# Patient Record
Sex: Female | Born: 1970 | Hispanic: No | Smoking: Never smoker
Health system: Southern US, Community
[De-identification: ages and names within clinical notes are randomized; demographics above are authoritative.]

## PROBLEM LIST (undated history)

## (undated) DIAGNOSIS — J129 Viral pneumonia, unspecified: Secondary | ICD-10-CM

## (undated) DIAGNOSIS — M1990 Unspecified osteoarthritis, unspecified site: Secondary | ICD-10-CM

## (undated) DIAGNOSIS — J302 Other seasonal allergic rhinitis: Secondary | ICD-10-CM

## (undated) DIAGNOSIS — C801 Malignant (primary) neoplasm, unspecified: Secondary | ICD-10-CM

## (undated) DIAGNOSIS — G43909 Migraine, unspecified, not intractable, without status migrainosus: Secondary | ICD-10-CM

## (undated) DIAGNOSIS — N83209 Unspecified ovarian cyst, unspecified side: Secondary | ICD-10-CM

## (undated) DIAGNOSIS — L989 Disorder of the skin and subcutaneous tissue, unspecified: Secondary | ICD-10-CM

## (undated) DIAGNOSIS — G43109 Migraine with aura, not intractable, without status migrainosus: Secondary | ICD-10-CM

## (undated) DIAGNOSIS — N926 Irregular menstruation, unspecified: Secondary | ICD-10-CM

## (undated) HISTORY — PX: WISDOM TOOTH EXTRACTION: SHX21

## (undated) HISTORY — DX: Unspecified osteoarthritis, unspecified site: M19.90

## (undated) HISTORY — DX: Other seasonal allergic rhinitis: J30.2

## (undated) HISTORY — DX: Viral pneumonia, unspecified: J12.9

## (undated) HISTORY — DX: Irregular menstruation, unspecified: N92.6

## (undated) HISTORY — DX: Unspecified ovarian cyst, unspecified side: N83.209

## (undated) HISTORY — DX: Disorder of the skin and subcutaneous tissue, unspecified: L98.9

## (undated) HISTORY — DX: Migraine with aura, not intractable, without status migrainosus: G43.109

## (undated) HISTORY — DX: Migraine, unspecified, not intractable, without status migrainosus: G43.909

---

## 2010-04-24 ENCOUNTER — Inpatient Hospital Stay: Payer: Self-pay

## 2012-02-12 ENCOUNTER — Ambulatory Visit: Payer: Self-pay | Admitting: Obstetrics & Gynecology

## 2012-02-12 LAB — CBC
HCT: 38.8 % (ref 35.0–47.0)
MCH: 30.5 pg (ref 26.0–34.0)
MCHC: 33.4 g/dL (ref 32.0–36.0)
Platelet: 142 10*3/uL — ABNORMAL LOW (ref 150–440)
RBC: 4.24 10*6/uL (ref 3.80–5.20)
RDW: 12.7 % (ref 11.5–14.5)

## 2012-02-17 ENCOUNTER — Ambulatory Visit: Payer: Self-pay | Admitting: Obstetrics & Gynecology

## 2012-02-18 LAB — PATHOLOGY REPORT

## 2014-06-20 NOTE — Op Note (Signed)
PATIENT NAME:  Maria Ward, Maria Ward MR#:  [id number] DATE OF BIRTH:  Feb 24, 1971  DATE OF PROCEDURE:  02/17/2012  PREOPERATIVE DIAGNOSIS: Ovarian cyst pain.   POSTOPERATIVE DIAGNOSIS: Ovarian cyst pain.  PROCEDURE: Operative laparoscopy with right ovarian cystectomy.   SURGEON: Barnett Applebaum, MD   ANESTHESIA: General.   ESTIMATED BLOOD LOSS: Minimal.   COMPLICATIONS: None.   FINDINGS: A right ovarian cyst is identified, hemorrhagic in nature. A left tubal cyst is visualized. Normal appendix and uterus.   DISPOSITION: To recovery room stable.   TECHNIQUE: The patient is prepped and draped in the usual sterile fashion. After adequate anesthesia is attained in the dorsal lithotomy position, a  sponge stick is placed vaginally, and the bladder is drained with a red Robinson catheter.   Attention is then turned to the abdomen where a Veress needle is inserted through a 5 mm infraumbilical incision after Marcaine is used to anesthetize the skin. Veress needle placement is confirmed using the hanging drop technique, and the abdomen is then insufflated with CO2 gas. A 5 mm trocar is then inserted under direct visualization with the laparoscope with no injuries or bleeding noted. The patient is placed in Trendelenburg position, and the above mentioned findings are visualized.   A 5 mm trocar is placed in the right lower quadrant, and an 11 mm trocar is placed in the left lower quadrant lateral to the inferior epigastric blood vessels with no injuries or bleeding noted. The right ovarian cyst is grasped and carefully dissected free using the 5 mm harmonic scalpel with excellent hemostasis noted at the operative site. The cyst is removed and sent to pathology for further review. The left tubal cyst is also easily excised and removed and sent with the specimen. Excellent hemostasis is noted. Fluid is aspirated from the posterior cul-de-sac with a syringe. Trocars are removed and gas is expelled, and the skin is  closed with Dermabond. The patient goes to the recovery room in stable condition. All sponge, instrument, and needle counts are correct.   ____________________________ R. Barnett Applebaum, MD rph:cb D: 02/17/2012 11:03:41 ET T: 02/17/2012 20:49:55 ET JOB#: [id number]  cc: Glean Salen, MD, <Dictator> Gae Dry MD ELECTRONICALLY SIGNED 02/18/2012 6:25

## 2015-01-02 HISTORY — PX: OVARIAN CYST REMOVAL: SHX89

## 2016-12-01 ENCOUNTER — Encounter: Payer: Self-pay | Admitting: Obstetrics and Gynecology

## 2016-12-01 ENCOUNTER — Ambulatory Visit (INDEPENDENT_AMBULATORY_CARE_PROVIDER_SITE_OTHER): Payer: Self-pay | Admitting: Obstetrics and Gynecology

## 2016-12-01 VITALS — BP 112/72 | HR 85 | Ht 65.0 in | Wt 172.0 lb

## 2016-12-01 DIAGNOSIS — Z3041 Encounter for surveillance of contraceptive pills: Secondary | ICD-10-CM

## 2016-12-01 DIAGNOSIS — Z1231 Encounter for screening mammogram for malignant neoplasm of breast: Secondary | ICD-10-CM

## 2016-12-01 DIAGNOSIS — Z01419 Encounter for gynecological examination (general) (routine) without abnormal findings: Secondary | ICD-10-CM

## 2016-12-01 DIAGNOSIS — Z1239 Encounter for other screening for malignant neoplasm of breast: Secondary | ICD-10-CM

## 2016-12-01 MED ORDER — NORETHINDRONE 0.35 MG PO TABS
1.0000 | ORAL_TABLET | Freq: Every day | ORAL | 3 refills | Status: DC
Start: 2016-12-01 — End: 2017-12-21

## 2016-12-01 NOTE — Progress Notes (Signed)
PCP:  Lynnell Jude, MD   Chief Complaint  Patient presents with  . Gynecologic Exam     HPI:      Maria Ward is a 46 y.o. [phone] who LMP was Patient's last menstrual period was 10/19/2016 (exact date)., presents today for her annual examination.  Her menses are Q1-6 months with POPs, lasting 7 days.  Dysmenorrhea none. She does not have intermenstrual bleeding. Migraines improved this yr.  Sex activity: single partner, contraception - oral progesterone-only contraceptive.  Last Pap: September 01, 2014  Results were: no abnormalities /neg HPV DNA   Last mammogram: not recently.  There is a FH of breast cancer in her PGM, genetic testing not indicated. There is no FH of ovarian cancer. The patient does not do self-breast exams.  Tobacco use: The patient denies current or previous tobacco use. Alcohol use: glass of wine with dinner No drug use.  Exercise: not active  She does get adequate calcium and Vitamin D in her diet.  Labs with PCP.     Past Medical History:  Diagnosis Date  . Irregular menses   . Migraine   . Ovarian cyst   . Seasonal allergies     Past Surgical History:  Procedure Laterality Date  . OVARIAN CYST REMOVAL  01/2015  . WISDOM TOOTH EXTRACTION      Family History  Problem Relation Age of Onset  . Bladder Cancer Mother   . Colon polyps Mother   . Gallbladder disease Mother   . Hyperlipidemia Mother   . Thyroid disease Mother   . Hypertension Father   . Skin cancer Father   . Breast cancer Paternal Grandmother 22    Social History   Social History  . Marital status: Unknown    Spouse name: N/A  . Number of children: N/A  . Years of education: N/A   Occupational History  . Not on file.   Social History Main Topics  . Smoking status: Never Smoker  . Smokeless tobacco: Never Used  . Alcohol use 0.6 oz/week    1 Glasses of wine per week     Comment: every night with dinner  . Drug use: No  . Sexual activity: Yes    Birth  control/ protection: Pill   Other Topics Concern  . Not on file   Social History Narrative  . No narrative on file    Current Meds  Medication Sig  . norethindrone (MICRONOR,CAMILA,ERRIN) 0.35 MG tablet Take 1 tablet (0.35 mg total) by mouth daily.  . [DISCONTINUED] norethindrone (MICRONOR,CAMILA,ERRIN) 0.35 MG tablet Take 1 tablet by mouth daily.     ROS:  Review of Systems  Constitutional: Negative for fatigue, fever and unexpected weight change.  Respiratory: Negative for cough, shortness of breath and wheezing.   Cardiovascular: Negative for chest pain, palpitations and leg swelling.  Gastrointestinal: Negative for blood in stool, constipation, diarrhea, nausea and vomiting.  Endocrine: Negative for cold intolerance, heat intolerance and polyuria.  Genitourinary: Negative for dyspareunia, dysuria, flank pain, frequency, genital sores, hematuria, menstrual problem, pelvic pain, urgency, vaginal bleeding, vaginal discharge and vaginal pain.  Musculoskeletal: Negative for back pain, joint swelling and myalgias.  Skin: Negative for rash.  Neurological: Negative for dizziness, syncope, light-headedness, numbness and headaches.  Hematological: Negative for adenopathy.  Psychiatric/Behavioral: Negative for agitation, confusion, sleep disturbance and suicidal ideas. The patient is not nervous/anxious.      Objective: BP 112/72   Pulse 85   Ht 5\' 5"  (1.651 m)  Wt 172 lb (78 kg)   LMP 10/19/2016 (Exact Date)   BMI 28.62 kg/m    Physical Exam  Constitutional: She is oriented to person, place, and time. She appears well-developed and well-nourished.  Genitourinary: Vagina normal and uterus normal. There is no rash or tenderness on the right labia. There is no rash or tenderness on the left labia. No erythema or tenderness in the vagina. No vaginal discharge found. Right adnexum does not display mass and does not display tenderness. Left adnexum does not display mass and does not  display tenderness. Cervix does not exhibit motion tenderness or polyp. Uterus is not enlarged or tender.  Neck: Normal range of motion. No thyromegaly present.  Cardiovascular: Normal rate, regular rhythm and normal heart sounds.   No murmur heard. Pulmonary/Chest: Effort normal and breath sounds normal. Right breast exhibits no mass, no nipple discharge, no skin change and no tenderness. Left breast exhibits no mass, no nipple discharge, no skin change and no tenderness.  Abdominal: Soft. There is no tenderness. There is no guarding.  Musculoskeletal: Normal range of motion.  Neurological: She is alert and oriented to person, place, and time. No cranial nerve deficit.  Psychiatric: She has a normal mood and affect. Her behavior is normal.  Vitals reviewed.   Assessment/Plan: Encounter for annual routine gynecological examination  Screening for breast cancer - Will sched through [facility]. - Plan: MM DIGITAL SCREENING BILATERAL  Encounter for surveillance of contraceptive pills - OCP RF.  - Plan: norethindrone (MICRONOR,CAMILA,ERRIN) 0.35 MG tablet  Meds ordered this encounter  Medications  . DISCONTD: norethindrone (MICRONOR,CAMILA,ERRIN) 0.35 MG tablet    Sig: Take 1 tablet by mouth daily.  . norethindrone (MICRONOR,CAMILA,ERRIN) 0.35 MG tablet    Sig: Take 1 tablet (0.35 mg total) by mouth daily.    Dispense:  3 Package    Refill:  3             GYN counsel mammography screening, adequate intake of calcium and vitamin D, diet and exercise    F/U  Return in about 1 year (around 12/01/2017).  Sanad Fearnow B. Dylan Ruotolo, PA-C 12/01/2016 10:38 AM

## 2016-12-26 ENCOUNTER — Ambulatory Visit
Admission: RE | Admit: 2016-12-26 | Discharge: 2016-12-26 | Disposition: A | Discharge status: Home / Self Care | Payer: Self-pay | Source: Ambulatory Visit | Attending: Obstetrics and Gynecology | Admitting: Obstetrics and Gynecology

## 2016-12-26 DIAGNOSIS — Z1239 Encounter for other screening for malignant neoplasm of breast: Secondary | ICD-10-CM

## 2017-01-02 ENCOUNTER — Other Ambulatory Visit: Payer: Self-pay | Admitting: *Deleted

## 2017-01-02 ENCOUNTER — Inpatient Hospital Stay
Admission: RE | Admit: 2017-01-02 | Discharge: 2017-01-02 | Disposition: A | Discharge status: Home / Self Care | Payer: Self-pay | Source: Ambulatory Visit | Attending: *Deleted | Admitting: *Deleted

## 2017-01-02 DIAGNOSIS — Z9289 Personal history of other medical treatment: Secondary | ICD-10-CM

## 2017-01-07 ENCOUNTER — Other Ambulatory Visit: Payer: Self-pay | Admitting: Obstetrics and Gynecology

## 2017-01-07 DIAGNOSIS — N6489 Other specified disorders of breast: Secondary | ICD-10-CM

## 2017-01-07 DIAGNOSIS — R928 Other abnormal and inconclusive findings on diagnostic imaging of breast: Secondary | ICD-10-CM

## 2017-01-13 ENCOUNTER — Ambulatory Visit
Admission: RE | Admit: 2017-01-13 | Discharge: 2017-01-13 | Disposition: A | Discharge status: Home / Self Care | Payer: Self-pay | Source: Ambulatory Visit | Attending: Obstetrics and Gynecology | Admitting: Obstetrics and Gynecology

## 2017-01-13 ENCOUNTER — Encounter: Payer: Self-pay | Admitting: Obstetrics and Gynecology

## 2017-01-13 DIAGNOSIS — R928 Other abnormal and inconclusive findings on diagnostic imaging of breast: Secondary | ICD-10-CM

## 2017-01-13 DIAGNOSIS — N6489 Other specified disorders of breast: Secondary | ICD-10-CM

## 2017-12-21 ENCOUNTER — Ambulatory Visit: Payer: Self-pay | Admitting: Obstetrics and Gynecology

## 2017-12-21 ENCOUNTER — Telehealth: Payer: Self-pay | Admitting: Obstetrics and Gynecology

## 2017-12-21 ENCOUNTER — Other Ambulatory Visit: Payer: Self-pay | Admitting: Obstetrics and Gynecology

## 2017-12-21 DIAGNOSIS — Z3041 Encounter for surveillance of contraceptive pills: Secondary | ICD-10-CM

## 2017-12-21 NOTE — Telephone Encounter (Signed)
Patient originally had appt for annual 10/21 but we had to r/s, she is rescheduled to 11/5 but will not have enough bc to get her to that point.  She needs Rx on paper so she can carry to to pharmacy to see which is cheaper.

## 2017-12-21 NOTE — Telephone Encounter (Signed)
Please advise 

## 2017-12-22 ENCOUNTER — Other Ambulatory Visit: Payer: Self-pay | Admitting: Obstetrics and Gynecology

## 2017-12-22 DIAGNOSIS — Z3041 Encounter for surveillance of contraceptive pills: Secondary | ICD-10-CM

## 2017-12-22 MED ORDER — NORETHINDRONE 0.35 MG PO TABS: 1.0000 | ORAL_TABLET | Freq: Every day | ORAL | 0 refills | Status: DC

## 2017-12-22 NOTE — Progress Notes (Signed)
Rx RF till annual. Pt needs hard copy.

## 2017-12-22 NOTE — Telephone Encounter (Signed)
Rx printed. Does pt want to pick up?

## 2017-12-22 NOTE — Telephone Encounter (Signed)
Pt would like Rx faxed to pharmacy on chart [facility] on [address].)

## 2017-12-22 NOTE — Telephone Encounter (Signed)
Called pt and left vm to call back.

## 2018-01-05 ENCOUNTER — Ambulatory Visit: Payer: Self-pay | Admitting: Obstetrics and Gynecology

## 2018-01-05 ENCOUNTER — Encounter: Payer: Self-pay | Admitting: Obstetrics and Gynecology

## 2018-01-05 VITALS — BP 106/70 | HR 101 | Ht 65.0 in | Wt 178.0 lb

## 2018-01-05 DIAGNOSIS — Z3041 Encounter for surveillance of contraceptive pills: Secondary | ICD-10-CM

## 2018-01-05 DIAGNOSIS — Z13220 Encounter for screening for lipoid disorders: Secondary | ICD-10-CM

## 2018-01-05 DIAGNOSIS — Z124 Encounter for screening for malignant neoplasm of cervix: Secondary | ICD-10-CM

## 2018-01-05 DIAGNOSIS — N938 Other specified abnormal uterine and vaginal bleeding: Secondary | ICD-10-CM

## 2018-01-05 DIAGNOSIS — Z0000 Encounter for general adult medical examination without abnormal findings: Secondary | ICD-10-CM

## 2018-01-05 DIAGNOSIS — Z1239 Encounter for other screening for malignant neoplasm of breast: Secondary | ICD-10-CM

## 2018-01-05 DIAGNOSIS — Z1151 Encounter for screening for human papillomavirus (HPV): Secondary | ICD-10-CM

## 2018-01-05 DIAGNOSIS — Z01419 Encounter for gynecological examination (general) (routine) without abnormal findings: Secondary | ICD-10-CM

## 2018-01-05 LAB — HM PAP SMEAR: HM Pap smear: NORMAL

## 2018-01-05 MED ORDER — NORETHINDRONE 0.35 MG PO TABS: 1.0000 | ORAL_TABLET | Freq: Every day | ORAL | 3 refills | Status: DC

## 2018-01-05 NOTE — Patient Instructions (Signed)
I value your feedback and entrusting us with your care. If you get a [facility] patient survey, I would appreciate you taking the time to let us know about your experience today. Thank you! 

## 2018-01-05 NOTE — Progress Notes (Signed)
PCP:  Lynnell Jude, MD   Chief Complaint  Patient presents with  . Gynecologic Exam    had 3 periods in October, concerned, thinks she felt a lump on her left breast, no tenderness x a few days ago     HPI:      Ms. Maria Ward is a 47 y.o. [phone] who LMP was Patient's last menstrual period was 12/30/2017 (exact date)., presents today for her annual examination.  Her menses are Q5-8 wks with POPs, lasting 6-7 days.  Dysmenorrhea none. She had 3 periods in Oct. 2 were very heavy, changing pads/tampons hourly and with clots. The 3rd one lasted 5 days and was a little lighter. No late/missed pills. No pelvic pain. Unusual for pt. Migraines improved this yr.   Sex activity: single partner, contraception - oral progesterone-only contraceptive.  Last Pap: September 01, 2014  Results were: no abnormalities /neg HPV DNA   Last mammogram: 01/13/17 Results: no abnormalities after addl views; repeat in 12 months. Pt noted LT breast mass, size of a marble, a few wks ago but sx have resolved. Can no longer feel it. Hx of cluster of LT breast cysts on mammo/u/s last yr.  There is a FH of breast cancer in her PGM, genetic testing not indicated. There is no FH of ovarian cancer. The patient does not do self-breast exams.  Tobacco use: The patient denies current or previous tobacco use. Alcohol use: glass of wine with dinner No drug use.  Exercise: occas active  She does get adequate calcium but not Vitamin D in her diet.  No recent labs.    Past Medical History:  Diagnosis Date  . Arthritis    knees  . Irregular menses   . Migraine   . Ovarian cyst   . Pneumonia, viral   . Seasonal allergies   . Skin disorder    skin rash on right arm, tx with Triamcinolone per Dermatologist    Past Surgical History:  Procedure Laterality Date  . OVARIAN CYST REMOVAL  01/2015  . WISDOM TOOTH EXTRACTION      Family History  Problem Relation Age of Onset  . Bladder Cancer Mother   . Colon polyps  Mother   . Gallbladder disease Mother   . Hyperlipidemia Mother   . Thyroid disease Mother   . Hypertension Father   . Skin cancer Father   . Breast cancer Paternal Grandmother 95       Malignant    Social History   Socioeconomic History  . Marital status: Unknown    Spouse name: Not on file  . Number of children: Not on file  . Years of education: Not on file  . Highest education level: Not on file  Occupational History  . Not on file  Social Needs  . Financial resource strain: Not on file  . Food insecurity:    Worry: Not on file    Inability: Not on file  . Transportation needs:    Medical: Not on file    Non-medical: Not on file  Tobacco Use  . Smoking status: Never Smoker  . Smokeless tobacco: Never Used  Substance and Sexual Activity  . Alcohol use: Yes    Alcohol/week: 1.0 standard drinks    Types: 1 Glasses of wine per week    Comment: every night with dinner  . Drug use: No  . Sexual activity: Yes    Birth control/protection: Pill  Lifestyle  . Physical activity:  Days per week: Not on file    Minutes per session: Not on file  . Stress: Not on file  Relationships  . Social connections:    Talks on phone: Not on file    Gets together: Not on file    Attends religious service: Not on file    Active member of club or organization: Not on file    Attends meetings of clubs or organizations: Not on file    Relationship status: Not on file  . Intimate partner violence:    Fear of current or ex partner: Not on file    Emotionally abused: Not on file    Physically abused: Not on file    Forced sexual activity: Not on file  Other Topics Concern  . Not on file  Social History Narrative  . Not on file    Current Meds  Medication Sig  . norethindrone (HEATHER) 0.35 MG tablet Take 1 tablet (0.35 mg total) by mouth daily.  . [DISCONTINUED] norethindrone (HEATHER) 0.35 MG tablet Take 1 tablet (0.35 mg total) by mouth daily.     ROS:  Review of  Systems  Constitutional: Negative for fatigue, fever and unexpected weight change.  Respiratory: Negative for cough, shortness of breath and wheezing.   Cardiovascular: Negative for chest pain, palpitations and leg swelling.  Gastrointestinal: Negative for blood in stool, constipation, diarrhea, nausea and vomiting.  Endocrine: Negative for cold intolerance, heat intolerance and polyuria.  Genitourinary: Positive for menstrual problem. Negative for dyspareunia, dysuria, flank pain, frequency, genital sores, hematuria, pelvic pain, urgency, vaginal bleeding, vaginal discharge and vaginal pain.  Musculoskeletal: Positive for arthralgias. Negative for back pain, joint swelling and myalgias.  Skin: Negative for rash.  Neurological: Negative for dizziness, syncope, light-headedness, numbness and headaches.  Hematological: Negative for adenopathy.  Psychiatric/Behavioral: Negative for agitation, confusion, sleep disturbance and suicidal ideas. The patient is not nervous/anxious.      Objective: BP 106/70   Pulse (!) 101   Ht 5\' 5"  (1.651 m)   Wt 178 lb (80.7 kg)   LMP 12/30/2017 (Exact Date)   BMI 29.62 kg/m    Physical Exam  Constitutional: She is oriented to person, place, and time. She appears well-developed and well-nourished.  Genitourinary: Vagina normal and uterus normal. There is no rash or tenderness on the right labia. There is no rash or tenderness on the left labia. No erythema or tenderness in the vagina. No vaginal discharge found. Right adnexum does not display mass and does not display tenderness. Left adnexum does not display mass and does not display tenderness. Cervix does not exhibit motion tenderness or polyp. Uterus is not enlarged or tender.  Neck: Normal range of motion. No thyromegaly present.  Cardiovascular: Normal rate, regular rhythm and normal heart sounds.  No murmur heard. Pulmonary/Chest: Effort normal and breath sounds normal. Right breast exhibits no mass,  no nipple discharge, no skin change and no tenderness. Left breast exhibits no mass, no nipple discharge, no skin change and no tenderness.  Abdominal: Soft. There is no tenderness. There is no guarding.  Musculoskeletal: Normal range of motion.  Neurological: She is alert and oriented to person, place, and time. No cranial nerve deficit.  Psychiatric: She has a normal mood and affect. Her behavior is normal.  Vitals reviewed.   Assessment/Plan: Encounter for annual routine gynecological examination  Cervical cancer screening - Plan: Cytology - PAP  Screening for HPV (human papillomavirus) - Plan: Cytology - PAP  Encounter for surveillance of contraceptive pills -  POP RF.  - Plan: norethindrone (HEATHER) 0.35 MG tablet  DUB (dysfunctional uterine bleeding) - On POPs last month. Follow sx expectantly. If persist, will check thyroid with future labs. F/u prn.   Screening for breast cancer - Pt to sched scr mammo. Neg breast exam/no masses.  - Plan: MM 3D SCREEN BREAST BILATERAL  Blood tests for routine general physical examination - Plan: Comprehensive metabolic panel, Lipid panel  Screening cholesterol level - Plan: Lipid panel  Meds ordered this encounter  Medications  . norethindrone (HEATHER) 0.35 MG tablet    Sig: Take 1 tablet (0.35 mg total) by mouth daily.    Dispense:  84 tablet    Refill:  3    Order Specific Question:   Supervising Provider    Answer:   Gae Dry [[id number]]             GYN counsel mammography screening, adequate intake of calcium and vitamin D, diet and exercise    F/U  Return in about 1 year (around 01/06/2019).  Tawsha Terrero B. Gian Ybarra, PA-C 01/05/2018 3:10 PM

## 2018-01-06 ENCOUNTER — Other Ambulatory Visit: Payer: Self-pay | Admitting: Obstetrics and Gynecology

## 2018-01-06 ENCOUNTER — Telehealth: Payer: Self-pay

## 2018-01-06 DIAGNOSIS — Z0000 Encounter for general adult medical examination without abnormal findings: Secondary | ICD-10-CM

## 2018-01-06 DIAGNOSIS — Z13220 Encounter for screening for lipoid disorders: Secondary | ICD-10-CM

## 2018-01-06 NOTE — Telephone Encounter (Signed)
Orders changed and released.

## 2018-01-06 NOTE — Telephone Encounter (Signed)
Pt would like the order for her lab work to be changed to where she can have her blood work done at a [place] facility sense her son has to have blood drawn there anyway. Thank you!

## 2018-01-06 NOTE — Addendum Note (Signed)
Addended by: Ardeth Perfect B on: 20/10/1386 02:04 PM   Modules accepted: Orders

## 2018-01-07 LAB — CYTOLOGY - PAP
Diagnosis: NEGATIVE
HPV: NOT DETECTED

## 2018-09-28 IMAGING — MG MM DIGITAL DIAGNOSTIC UNILAT*L* W/ TOMO W/ CAD
6 series · 6 of 14 positions shown · non-contrast
Comparison: Previous exam(s).

CLINICAL DATA: The patient was called back from screening
mammography due to a left breast asymmetry.

EXAM:
2D DIGITAL DIAGNOSTIC LEFT MAMMOGRAM WITH CAD AND ADJUNCT TOMO
ULTRASOUND LEFT BREAST

[L MLO]
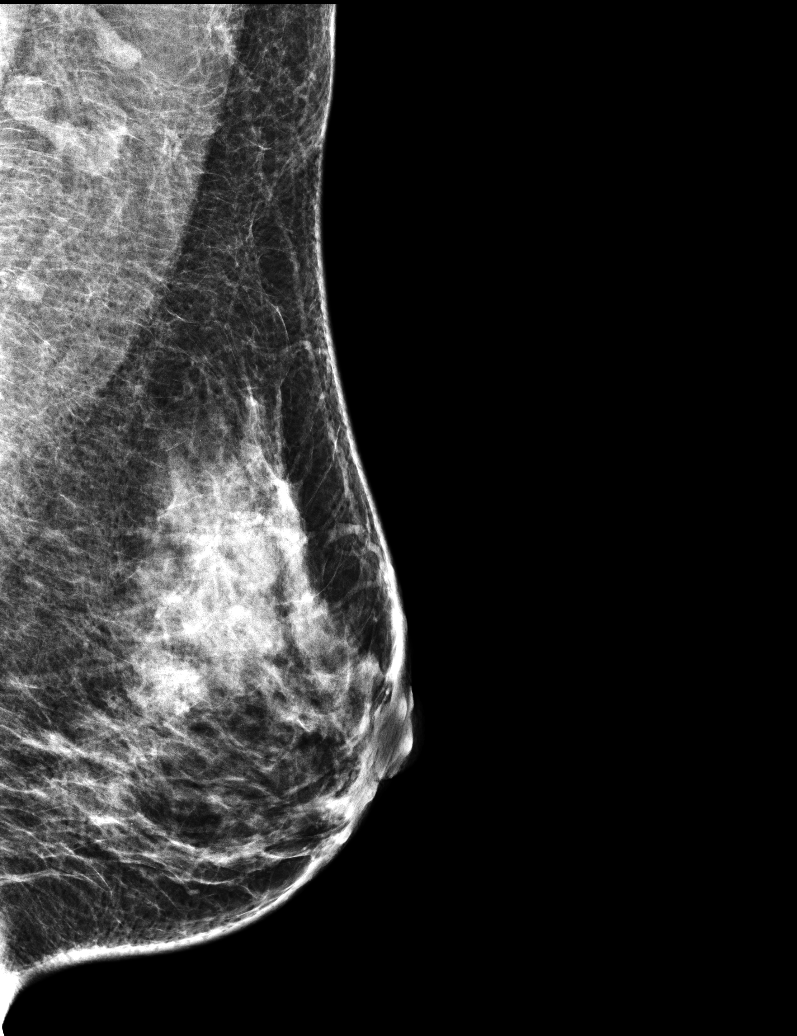

[L CC]
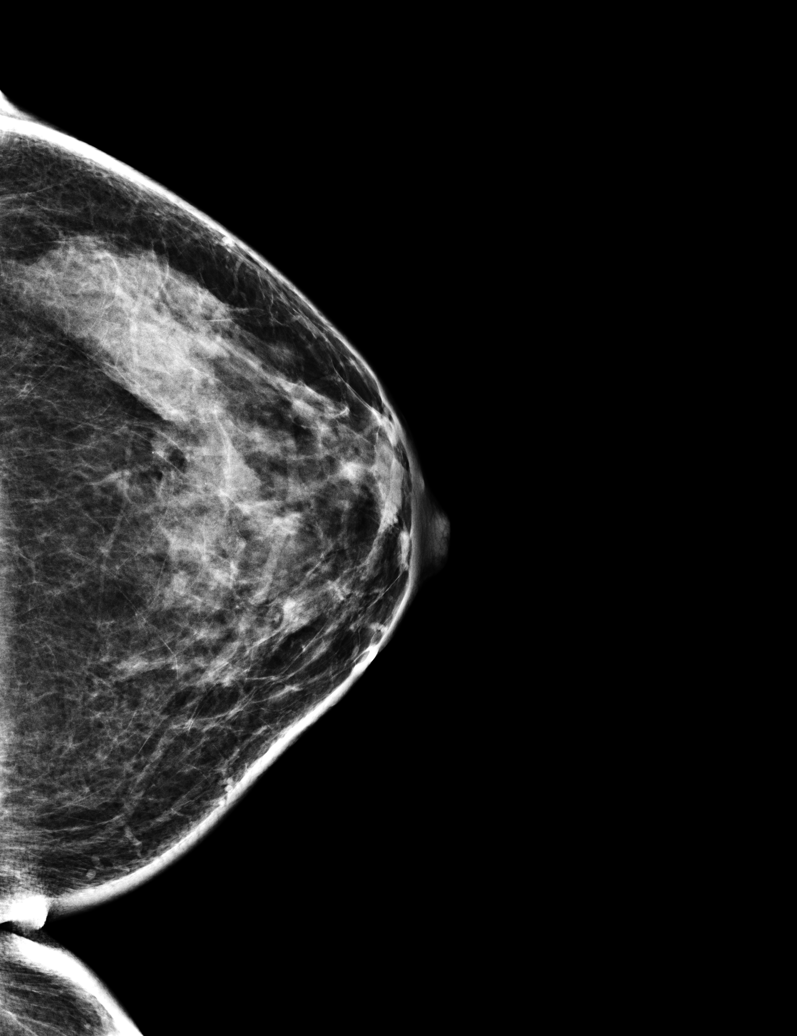

[L MLO synth-2D]
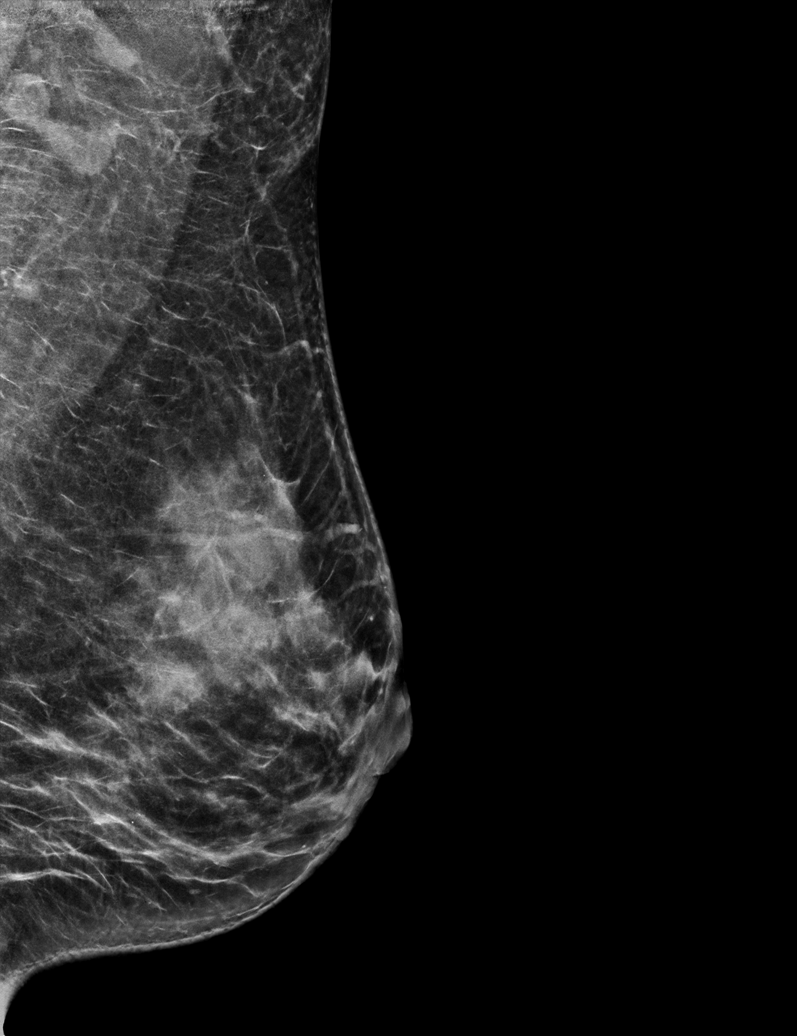

[L CC synth-2D]
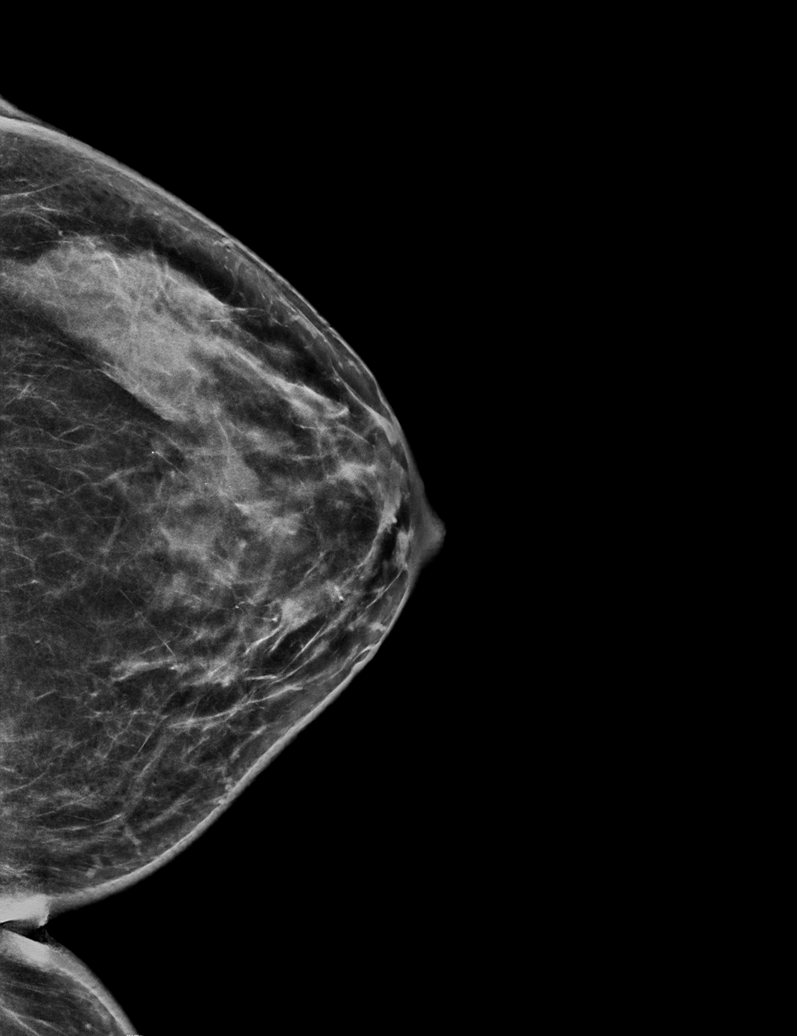

[L CC tomo · tomo slice 31/62.0]
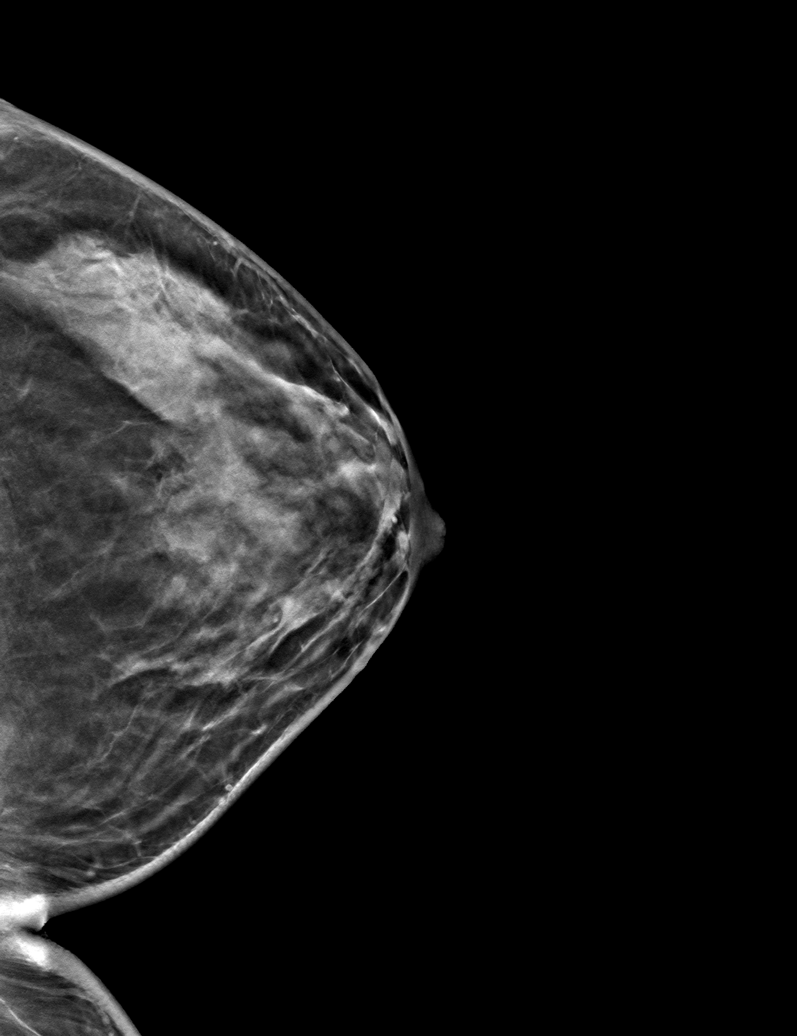

[L MLO tomo · tomo slice 32/63.0]
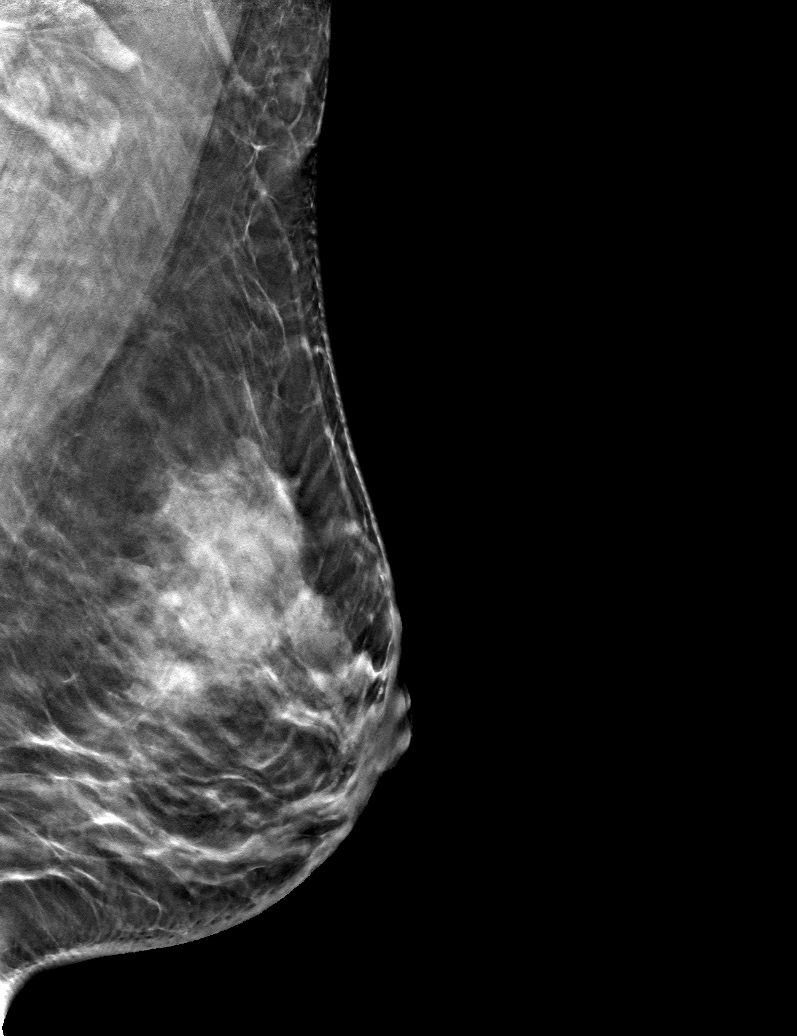

[6 of 14 positions shown; findings below may reference images not displayed]

ACR Breast Density Category c: The breast tissue is heterogeneously
dense, which may obscure small masses.
FINDINGS: The asymmetry on the left resolves on additional imaging.

Mammographic images were processed with CAD.

On physical exam, no suspicious findings are identified.

Targeted ultrasound is performed, showing a cluster of cysts in the
upper left breast. No suspicious findings. Ultrasound of the entire
lateral right breast demonstrated dense glandular tissue.
IMPRESSION: No mammographic or sonographic evidence of malignancy.

RECOMMENDATION:
Annual screening mammography

I have discussed the findings and recommendations with the patient.
Results were also provided in writing at the conclusion of the
visit. If applicable, a reminder letter will be sent to the patient
regarding the next appointment.

BI-RADS CATEGORY  2: Benign.

## 2018-11-02 ENCOUNTER — Ambulatory Visit: Payer: Self-pay | Admitting: Obstetrics and Gynecology

## 2018-12-07 ENCOUNTER — Other Ambulatory Visit: Payer: Self-pay | Admitting: Obstetrics and Gynecology

## 2018-12-07 DIAGNOSIS — Z1231 Encounter for screening mammogram for malignant neoplasm of breast: Secondary | ICD-10-CM

## 2018-12-27 ENCOUNTER — Other Ambulatory Visit: Payer: Self-pay | Admitting: Obstetrics and Gynecology

## 2018-12-27 ENCOUNTER — Ambulatory Visit
Admission: RE | Admit: 2018-12-27 | Discharge: 2018-12-27 | Disposition: A | Discharge status: Home / Self Care | Payer: Self-pay | Source: Ambulatory Visit | Attending: Obstetrics and Gynecology | Admitting: Obstetrics and Gynecology

## 2018-12-27 ENCOUNTER — Encounter (INDEPENDENT_AMBULATORY_CARE_PROVIDER_SITE_OTHER): Payer: Self-pay

## 2018-12-27 ENCOUNTER — Other Ambulatory Visit: Payer: Self-pay

## 2018-12-27 DIAGNOSIS — N6310 Unspecified lump in the right breast, unspecified quadrant: Secondary | ICD-10-CM

## 2018-12-27 DIAGNOSIS — R928 Other abnormal and inconclusive findings on diagnostic imaging of breast: Secondary | ICD-10-CM

## 2018-12-27 DIAGNOSIS — Z1231 Encounter for screening mammogram for malignant neoplasm of breast: Secondary | ICD-10-CM | POA: Insufficient documentation

## 2018-12-27 HISTORY — DX: Malignant (primary) neoplasm, unspecified: C80.1

## 2019-01-05 ENCOUNTER — Encounter: Payer: Self-pay | Admitting: Obstetrics and Gynecology

## 2019-01-05 ENCOUNTER — Ambulatory Visit
Admission: RE | Admit: 2019-01-05 | Discharge: 2019-01-05 | Disposition: A | Discharge status: Home / Self Care | Payer: Self-pay | Source: Ambulatory Visit | Attending: Obstetrics and Gynecology | Admitting: Obstetrics and Gynecology

## 2019-01-05 ENCOUNTER — Telehealth: Payer: Self-pay | Admitting: Obstetrics and Gynecology

## 2019-01-05 DIAGNOSIS — R928 Other abnormal and inconclusive findings on diagnostic imaging of breast: Secondary | ICD-10-CM

## 2019-01-05 DIAGNOSIS — N6310 Unspecified lump in the right breast, unspecified quadrant: Secondary | ICD-10-CM

## 2019-01-05 NOTE — Telephone Encounter (Signed)
Pt aware of cat 3 RT breast due to cysts. Repeat u/s in 6 months. SBE and f/u if any breast changes. Has annual next wk. Questions answered.

## 2019-01-09 NOTE — Progress Notes (Signed)
PCP:  Lynnell Jude, MD   Chief Complaint  Patient presents with  . Gynecologic Exam    still having very very heavy cycles     HPI:      Ms. Maria Ward is a 48 y.o. [phone] who LMP was Patient's last menstrual period was 10/12/2018 (exact date)., presents today for her annual examination.  Her menses are Q1-3 months with POPs, lasting 7 days, heavy flow, changing tampons/pads Q1 hrs with clots.  Dysmenorrhea none.  Her LMP 8/20 lasted 21 days, heavy flow most of the time. Pt then stopped her POPs since not helpful with her cycles. Hx of migraines with aura. No recent thyroid/anemia labs. Hx of ovar cysts in past.   Sex activity: single partner, contraception - oral progesterone-only contraceptive. No dyspareunia/postcoital bleeding.  Last Pap: 01/05/18  Results were: no abnormalities /neg HPV DNA   Last mammogram: 01/05/19 Results: cat 3 RT breast due to cluster of cysts; repeat u/s due in 6 months.   There is a FH of breast cancer in her PGM, genetic testing not indicated. There is no FH of ovarian cancer. The patient does not do self-breast exams.  Tobacco use: The patient denies current or previous tobacco use. Alcohol use: glass of wine with dinner No drug use.  Exercise: occas active  She does get adequate calcium and Vitamin D in her diet. Sometimes gets iron supp in [facility].  Pt with migraines with aura. Had gone a couple yrs without them but have recurred twice in past 6 months. Had imitrex Rx in past and would like RF.   Never did fasting labs last yr.   Past Medical History:  Diagnosis Date  . Arthritis    knees  . Cancer ([place])    skin ca  . Irregular menses   . Migraine with aura   . Ovarian cyst   . Pneumonia, viral   . Seasonal allergies   . Skin disorder    skin rash on right arm, tx with Triamcinolone per Dermatologist    Past Surgical History:  Procedure Laterality Date  . OVARIAN CYST REMOVAL  01/2015  . WISDOM TOOTH EXTRACTION      Family  History  Problem Relation Age of Onset  . Bladder Cancer Mother   . Colon polyps Mother   . Gallbladder disease Mother   . Hyperlipidemia Mother   . Thyroid disease Mother   . Hypertension Father   . Skin cancer Father   . Breast cancer Paternal Grandmother 23       Malignant    Social History   Socioeconomic History  . Marital status: Unknown    Spouse name: Not on file  . Number of children: Not on file  . Years of education: Not on file  . Highest education level: Not on file  Occupational History  . Not on file  Social Needs  . Financial resource strain: Not on file  . Food insecurity    Worry: Not on file    Inability: Not on file  . Transportation needs    Medical: Not on file    Non-medical: Not on file  Tobacco Use  . Smoking status: Never Smoker  . Smokeless tobacco: Never Used  Substance and Sexual Activity  . Alcohol use: Yes    Alcohol/week: 1.0 standard drinks    Types: 1 Glasses of wine per week    Comment: every night with dinner  . Drug use: No  . Sexual activity: Yes  Birth control/protection: None  Lifestyle  . Physical activity    Days per week: Not on file    Minutes per session: Not on file  . Stress: Not on file  Relationships  . Social Herbalist on phone: Not on file    Gets together: Not on file    Attends religious service: Not on file    Active member of club or organization: Not on file    Attends meetings of clubs or organizations: Not on file    Relationship status: Not on file  . Intimate partner violence    Fear of current or ex partner: Not on file    Emotionally abused: Not on file    Physically abused: Not on file    Forced sexual activity: Not on file  Other Topics Concern  . Not on file  Social History Narrative  . Not on file    No outpatient medications have been marked as taking for the 01/10/19 encounter (Office Visit) with Cierria Height, Deirdre Evener, PA-C.     ROS:  Review of Systems  Constitutional:  Negative for fatigue, fever and unexpected weight change.  Respiratory: Negative for cough, shortness of breath and wheezing.   Cardiovascular: Negative for chest pain, palpitations and leg swelling.  Gastrointestinal: Negative for blood in stool, constipation, diarrhea, nausea and vomiting.  Endocrine: Negative for cold intolerance, heat intolerance and polyuria.  Genitourinary: Positive for menstrual problem. Negative for dyspareunia, dysuria, flank pain, frequency, genital sores, hematuria, pelvic pain, urgency, vaginal bleeding, vaginal discharge and vaginal pain.  Musculoskeletal: Negative for arthralgias, back pain, joint swelling and myalgias.  Skin: Negative for rash.  Neurological: Negative for dizziness, syncope, light-headedness, numbness and headaches.  Hematological: Negative for adenopathy.  Psychiatric/Behavioral: Negative for agitation, confusion, sleep disturbance and suicidal ideas. The patient is not nervous/anxious.      Objective: BP 110/80   Ht 5\' 5"  (1.651 m)   Wt 187 lb (84.8 kg)   LMP 10/12/2018 (Exact Date)   BMI 31.12 kg/m    Physical Exam Constitutional:      Appearance: She is well-developed.  Genitourinary:     Vulva, vagina, uterus, right adnexa and left adnexa normal.     No vulval lesion or tenderness noted.     No vaginal discharge, erythema or tenderness.     No cervical motion tenderness or polyp.     Uterus is not enlarged or tender.     No right or left adnexal mass present.     Right adnexa not tender.     Left adnexa not tender.  Neck:     Musculoskeletal: Normal range of motion.     Thyroid: No thyromegaly.  Cardiovascular:     Rate and Rhythm: Normal rate and regular rhythm.     Heart sounds: Normal heart sounds. No murmur.  Pulmonary:     Effort: Pulmonary effort is normal.     Breath sounds: Normal breath sounds.  Chest:     Breasts:        Right: No mass, nipple discharge, skin change or tenderness.        Left: No mass,  nipple discharge, skin change or tenderness.  Abdominal:     Palpations: Abdomen is soft.     Tenderness: There is no abdominal tenderness. There is no guarding.  Musculoskeletal: Normal range of motion.  Neurological:     General: No focal deficit present.     Mental Status: She is alert and oriented  to person, place, and time.     Cranial Nerves: No cranial nerve deficit.  Skin:    General: Skin is warm and dry.  Psychiatric:        Mood and Affect: Mood normal.        Behavior: Behavior normal.        Thought Content: Thought content normal.        Judgment: Judgment normal.  Vitals signs reviewed.    Results for orders placed or performed in visit on 01/10/19 (from the past 24 hour(s))  POCT hemoglobin     Status: Normal   Collection Time: 01/10/19 11:36 AM  Result Value Ref Range   Hemoglobin 13.4 11 - 14.6 g/dL    Assessment/Plan: Encounter for annual routine gynecological examination  Encounter for screening mammogram for malignant neoplasm of breast; pt current on mammo. Repeat u/s RT breast due 4/21.  Menorrhagia with regular cycle - Plan: TSH + free T4, POCT hemoglobin; Normal HgB. Discussed resumption of POPs even though not great for menorrhagia, depo, IUD, ablation, lysteda (pt is self pay). Check thyroid. May need GYN u/s if DUB persists on POPs.  Encounter for surveillance of contraceptive pills - POP RF.  - Plan: norethindrone (HEATHER) 0.35 MG tablet; Rx RF. Start with next menses, f/u prn.  Intractable migraine with aura without status migrainosus - Plan: SUMAtriptan (IMITREX) 100 MG tablet; Rx RF Imitrex.  Meds ordered this encounter  Medications  . norethindrone (HEATHER) 0.35 MG tablet    Sig: Take 1 tablet (0.35 mg total) by mouth daily.    Dispense:  84 tablet    Refill:  3    Order Specific Question:   Supervising Provider    Answer:   Gae Dry [id number]  . SUMAtriptan (IMITREX) 100 MG tablet    Sig: Take 1 tablet (100 mg total) by mouth  once as needed for up to 1 dose for migraine. May repeat in 2 hours if headache persists or recurs.    Dispense:  9 tablet    Refill:  0    Order Specific Question:   Supervising Provider    Answer:   Gae Dry [id number]             GYN counsel mammography screening, adequate intake of calcium and vitamin D, diet and exercise    F/U  Return in about 1 year (around 01/10/2020).  Alline Pio B. Lamark Schue, PA-C 01/10/2019 12:05 PM

## 2019-01-10 ENCOUNTER — Other Ambulatory Visit: Payer: Self-pay

## 2019-01-10 ENCOUNTER — Ambulatory Visit (INDEPENDENT_AMBULATORY_CARE_PROVIDER_SITE_OTHER): Payer: Self-pay | Admitting: Obstetrics and Gynecology

## 2019-01-10 ENCOUNTER — Encounter: Payer: Self-pay | Admitting: Obstetrics and Gynecology

## 2019-01-10 VITALS — BP 110/80 | Ht 65.0 in | Wt 187.0 lb

## 2019-01-10 DIAGNOSIS — Z01419 Encounter for gynecological examination (general) (routine) without abnormal findings: Secondary | ICD-10-CM

## 2019-01-10 DIAGNOSIS — N920 Excessive and frequent menstruation with regular cycle: Secondary | ICD-10-CM | POA: Insufficient documentation

## 2019-01-10 DIAGNOSIS — Z3041 Encounter for surveillance of contraceptive pills: Secondary | ICD-10-CM

## 2019-01-10 DIAGNOSIS — G43119 Migraine with aura, intractable, without status migrainosus: Secondary | ICD-10-CM

## 2019-01-10 DIAGNOSIS — Z1231 Encounter for screening mammogram for malignant neoplasm of breast: Secondary | ICD-10-CM

## 2019-01-10 LAB — POCT HEMOGLOBIN: Hemoglobin: 13.4 g/dL (ref 11–14.6)

## 2019-01-10 MED ORDER — NORETHINDRONE 0.35 MG PO TABS: 1.0000 | ORAL_TABLET | Freq: Every day | ORAL | 3 refills | Status: AC

## 2019-01-10 MED ORDER — SUMATRIPTAN SUCCINATE 100 MG PO TABS: 100.0000 mg | ORAL_TABLET | Freq: Once | ORAL | 0 refills | Status: AC | PRN

## 2019-01-10 NOTE — Patient Instructions (Signed)
I value your feedback and entrusting us with your care. If you get a [facility] patient survey, I would appreciate you taking the time to let us know about your experience today. Thank you! 

## 2019-01-11 LAB — TSH+FREE T4
Free T4: 1.64 ng/dL (ref 0.82–1.77)
TSH: 1.47 u[IU]/mL (ref 0.450–4.500)

## 2019-01-11 NOTE — Telephone Encounter (Signed)
Can you all [facility] radiology and ask about this? Had LT breast cysts at 2018 mammo and u/s. Now with RT breast cysts on 2020 mammo and u/s. . Just want to clarify report is correct and LT breast is normal (compared to 2018). Thx.

## 2019-01-11 NOTE — Progress Notes (Signed)
Pls let pt know thyroid labs normal. F/u for GYN u/s if irregular bleeding happens again after restarting POPs.

## 2019-01-11 NOTE — Telephone Encounter (Signed)
Patient states she received a letter from our office regarding her recent breast imaging. The letter states Left breast. Patient states it was her Right breast that she had imaging on. She is inquiring if this is a clerical error in the letter or if the imaging was done on the wrong breast.

## 2019-01-11 NOTE — Progress Notes (Signed)
Pt aware.

## 2019-01-11 NOTE — Telephone Encounter (Signed)
Called pt, no answer, left detailed msg

## 2019-01-11 NOTE — Telephone Encounter (Signed)
Question was taken to technician and imaging was done on the right breast. Addendum will be done to correct error.

## 2019-01-11 NOTE — Telephone Encounter (Signed)
Ok. Can you pls let pt know report and imaging were correct and addendum will be made to report and released to pt.

## 2019-01-12 ENCOUNTER — Telehealth: Payer: Self-pay

## 2019-01-12 NOTE — Telephone Encounter (Signed)
Called pt, no answer, LVMTRC. 

## 2019-01-12 NOTE — Telephone Encounter (Signed)
Pt is calling to speak with Willette Pa about the paperwork she got from [place]. Pt states "I don't think they understand what Im trying to tell them at all" Pt would like a call back to speak about this. Thank you

## 2019-01-13 NOTE — Telephone Encounter (Signed)
Called pt to let her know report has been fixed, no answer, left detailed msg. Also advised she can call the hospital directly for further concerns.

## 2019-05-12 ENCOUNTER — Telehealth: Payer: Self-pay

## 2019-05-12 NOTE — Telephone Encounter (Signed)
Pt is looking for a new place to have her mammograms done. She says she loves [facility] and has nothing against the place, the big issue is billing (horribly high). She is aware you are out of the office. Please advise.

## 2019-05-13 NOTE — Telephone Encounter (Signed)
Pt aware, says she tried the pink ribbon fund about two years ago and was denied due to her husband's "high income".

## 2019-05-13 NOTE — Telephone Encounter (Signed)
Pt can try [facility], [facility] in [place] and [facility]. If she is self pay, may be able to do pink ribbon fund at [place].

## 2019-05-27 ENCOUNTER — Telehealth: Payer: Self-pay

## 2019-05-27 DIAGNOSIS — R928 Other abnormal and inconclusive findings on diagnostic imaging of breast: Secondary | ICD-10-CM

## 2019-05-27 NOTE — Telephone Encounter (Signed)
Pt called and asked that we put in an order for her to be able to get more imaging done on her breast because the people at [facility] told her she needed to have a 6 month follow up but cannot schedule until there is an order from the provider. Thank you

## 2019-05-30 NOTE — Addendum Note (Signed)
Addended by: Ardeth Perfect B on: [id number] 10:59 AM   Modules accepted: Orders

## 2019-05-30 NOTE — Telephone Encounter (Signed)
Pt aware.

## 2019-05-30 NOTE — Telephone Encounter (Signed)
Order placed. Pt should be able to schedule now. F/u prn. Thx.

## 2019-07-06 ENCOUNTER — Encounter: Payer: Self-pay | Admitting: Obstetrics and Gynecology

## 2019-07-06 ENCOUNTER — Encounter (INDEPENDENT_AMBULATORY_CARE_PROVIDER_SITE_OTHER): Payer: Self-pay

## 2019-07-06 ENCOUNTER — Ambulatory Visit
Admission: RE | Admit: 2019-07-06 | Discharge: 2019-07-06 | Disposition: A | Discharge status: Home / Self Care | Payer: Self-pay | Source: Ambulatory Visit | Attending: Obstetrics and Gynecology | Admitting: Obstetrics and Gynecology

## 2019-07-06 DIAGNOSIS — R928 Other abnormal and inconclusive findings on diagnostic imaging of breast: Secondary | ICD-10-CM | POA: Insufficient documentation

## 2019-12-22 ENCOUNTER — Other Ambulatory Visit: Payer: Self-pay | Admitting: Obstetrics and Gynecology

## 2019-12-22 DIAGNOSIS — N6310 Unspecified lump in the right breast, unspecified quadrant: Secondary | ICD-10-CM

## 2020-01-09 ENCOUNTER — Ambulatory Visit
Admission: RE | Admit: 2020-01-09 | Discharge: 2020-01-09 | Disposition: A | Discharge status: Home / Self Care | Payer: Self-pay | Source: Ambulatory Visit | Attending: Obstetrics and Gynecology | Admitting: Obstetrics and Gynecology

## 2020-01-09 ENCOUNTER — Other Ambulatory Visit: Payer: Self-pay

## 2020-01-09 ENCOUNTER — Encounter: Payer: Self-pay | Admitting: Obstetrics and Gynecology

## 2020-01-09 DIAGNOSIS — N6310 Unspecified lump in the right breast, unspecified quadrant: Secondary | ICD-10-CM

## 2020-01-11 ENCOUNTER — Other Ambulatory Visit: Payer: Self-pay

## 2020-01-11 ENCOUNTER — Encounter: Payer: Self-pay | Admitting: Obstetrics and Gynecology

## 2020-01-11 ENCOUNTER — Ambulatory Visit (INDEPENDENT_AMBULATORY_CARE_PROVIDER_SITE_OTHER): Payer: Self-pay | Admitting: Obstetrics and Gynecology

## 2020-01-11 VITALS — BP 122/78 | Ht 65.0 in | Wt 199.0 lb

## 2020-01-11 DIAGNOSIS — Z1231 Encounter for screening mammogram for malignant neoplasm of breast: Secondary | ICD-10-CM

## 2020-01-11 DIAGNOSIS — R102 Pelvic and perineal pain: Secondary | ICD-10-CM

## 2020-01-11 DIAGNOSIS — Z01419 Encounter for gynecological examination (general) (routine) without abnormal findings: Secondary | ICD-10-CM

## 2020-01-11 DIAGNOSIS — Z1211 Encounter for screening for malignant neoplasm of colon: Secondary | ICD-10-CM

## 2020-01-11 NOTE — Patient Instructions (Signed)
I value your feedback and entrusting us with your care. If you get a [facility] patient survey, I would appreciate you taking the time to let us know about your experience today. Thank you!  As of February 10, 2019, your lab results will be released to your MyChart immediately, before I even have a chance to see them. Please give me time to review them and contact you if there are any abnormalities. Thank you for your patience.  

## 2020-01-11 NOTE — Progress Notes (Signed)
PCP:  Lynnell Jude, MD   Chief Complaint  Patient presents with  . Gynecologic Exam     HPI:      Ms. Maria Ward is a 49 y.o. [phone] who LMP was Patient's last menstrual period was 01/04/2020., presents today for her annual examination.  Her menses are Q3-4 months off POPs for a yr, lasting 5 days, mod flow (heavy flow from last yr resolved). Dysmenorrhea none.  No BTB. Occas vasomotor sx.  Hx of migraines with aura.    Pt has had a few episodes BLQ throbbing pain over past month or so. Lasts a few min on one side and then may happen on the other side a few days later. No meds needed. No aggrav factors, no relation to cycle. No GI, urin, vag sx. Hx of ovar cysts. Just wanted to mention so she could do GYN u/s if sx persist/worsen.   Sex activity: single partner, contraception - none. No dyspareunia/postcoital bleeding.  Last Pap: 01/05/18  Results were: no abnormalities /neg HPV DNA   Last mammogram: 01/09/20 Results: cat 3 RT breast due to cluster of cysts; stable; repeat mammo and u/s due in 12 months.   There is a FH of breast cancer in her PGM, genetic testing not indicated. There is no FH of ovarian cancer. The patient does not do self-breast exams.  Tobacco use: The patient denies current or previous tobacco use. Alcohol use: 2 glasses of wine daily No drug use.  Exercise: mod active  She does get adequate calcium and Vitamin D in her diet. Sometimes gets iron supp in [facility].  Colonoscopy: never  Never did fasting labs   PT IS SELF PAY  Past Medical History:  Diagnosis Date  . Arthritis    knees  . Cancer ([place])    skin ca  . Irregular menses   . Migraine with aura   . Ovarian cyst   . Pneumonia, viral   . Seasonal allergies   . Skin disorder    skin rash on right arm, tx with Triamcinolone per Dermatologist    Past Surgical History:  Procedure Laterality Date  . OVARIAN CYST REMOVAL  01/2015  . WISDOM TOOTH EXTRACTION      Family History  Problem  Relation Age of Onset  . Bladder Cancer Mother   . Colon polyps Mother   . Gallbladder disease Mother   . Hyperlipidemia Mother   . Thyroid disease Mother   . Hypertension Father   . Skin cancer Father   . Breast cancer Paternal Grandmother 39       Malignant    Social History   Socioeconomic History  . Marital status: Unknown    Spouse name: Not on file  . Number of children: Not on file  . Years of education: Not on file  . Highest education level: Not on file  Occupational History  . Not on file  Tobacco Use  . Smoking status: Never Smoker  . Smokeless tobacco: Never Used  Vaping Use  . Vaping Use: Never used  Substance and Sexual Activity  . Alcohol use: Yes    Alcohol/week: 1.0 standard drink    Types: 1 Glasses of wine per week    Comment: every night with dinner  . Drug use: No  . Sexual activity: Yes    Birth control/protection: None  Other Topics Concern  . Not on file  Social History Narrative  . Not on file   Social Determinants of Health  Financial Resource Strain:   . Difficulty of Paying Living Expenses: Not on file  Food Insecurity:   . Worried About Charity fundraiser in the Last Year: Not on file  . Ran Out of Food in the Last Year: Not on file  Transportation Needs:   . Lack of Transportation (Medical): Not on file  . Lack of Transportation (Non-Medical): Not on file  Physical Activity:   . Days of Exercise per Week: Not on file  . Minutes of Exercise per Session: Not on file  Stress:   . Feeling of Stress : Not on file  Social Connections:   . Frequency of Communication with Friends and Family: Not on file  . Frequency of Social Gatherings with Friends and Family: Not on file  . Attends Religious Services: Not on file  . Active Member of Clubs or Organizations: Not on file  . Attends Archivist Meetings: Not on file  . Marital Status: Not on file  Intimate Partner Violence:   . Fear of Current or Ex-Partner: Not on file  .  Emotionally Abused: Not on file  . Physically Abused: Not on file  . Sexually Abused: Not on file    Current Meds  Medication Sig  . SUMAtriptan (IMITREX) 100 MG tablet Take 1 tablet (100 mg total) by mouth once as needed for up to 1 dose for migraine. May repeat in 2 hours if headache persists or recurs.     ROS:  Review of Systems  Constitutional: Negative for fatigue, fever and unexpected weight change.  Respiratory: Negative for cough, shortness of breath and wheezing.   Cardiovascular: Negative for chest pain, palpitations and leg swelling.  Gastrointestinal: Negative for blood in stool, constipation, diarrhea, nausea and vomiting.  Endocrine: Negative for cold intolerance, heat intolerance and polyuria.  Genitourinary: Positive for pelvic pain. Negative for dyspareunia, dysuria, flank pain, frequency, genital sores, hematuria, urgency, vaginal bleeding, vaginal discharge and vaginal pain.  Musculoskeletal: Negative for arthralgias, back pain, joint swelling and myalgias.  Skin: Negative for rash.  Neurological: Negative for dizziness, syncope, light-headedness, numbness and headaches.  Hematological: Negative for adenopathy.  Psychiatric/Behavioral: Negative for agitation, confusion, sleep disturbance and suicidal ideas. The patient is not nervous/anxious.      Objective: BP 122/78   Ht 5\' 5"  (1.651 m)   Wt 199 lb (90.3 kg)   LMP 01/04/2020   BMI 33.12 kg/m    Physical Exam Constitutional:      Appearance: She is well-developed.  Genitourinary:     Vulva, vagina, uterus, right adnexa and left adnexa normal.     No vulval lesion or tenderness noted.     No vaginal discharge, erythema or tenderness.     No cervical motion tenderness or polyp.     Uterus is not enlarged or tender.     No right or left adnexal mass present.     Right adnexa not tender.     Left adnexa not tender.  Neck:     Thyroid: No thyromegaly.  Cardiovascular:     Rate and Rhythm: Normal  rate and regular rhythm.     Heart sounds: Normal heart sounds. No murmur heard.   Pulmonary:     Effort: Pulmonary effort is normal.     Breath sounds: Normal breath sounds.  Chest:     Breasts:        Right: No mass, nipple discharge, skin change or tenderness.        Left: No mass,  nipple discharge, skin change or tenderness.  Abdominal:     Palpations: Abdomen is soft.     Tenderness: There is no abdominal tenderness. There is no guarding.  Musculoskeletal:        General: Normal range of motion.     Cervical back: Normal range of motion.  Neurological:     General: No focal deficit present.     Mental Status: She is alert and oriented to person, place, and time.     Cranial Nerves: No cranial nerve deficit.  Skin:    General: Skin is warm and dry.  Psychiatric:        Mood and Affect: Mood normal.        Behavior: Behavior normal.        Thought Content: Thought content normal.        Judgment: Judgment normal.  Vitals reviewed.     Assessment/Plan: Encounter for annual routine gynecological examination  Encounter for screening mammogram for malignant neoplasm of breast; pt current on mammo  Pelvic pain--occas BLQ. Neg exam today. Will call for GYN u/s when sx occur. See if related to menses.   Screening for colon cancer - Plan: Cologuard; colonoscopy/cologuard discussed. Pt elects cologuard. Ref sent. Will f/u with results.  PT IS SELF PAY           GYN counsel mammography screening, adequate intake of calcium and vitamin D, diet and exercise    F/U  Return in about 1 year (around 01/10/2021).  Darci Lykins B. Vershawn Westrup, PA-C 01/11/2020 2:46 PM

## 2020-03-15 ENCOUNTER — Telehealth: Payer: Self-pay

## 2020-03-15 NOTE — Telephone Encounter (Signed)
Per [facility], called pt to see if she is still planning on completing test. No answer, LVMTRC.

## 2020-09-19 IMAGING — US US BREAST*R* LIMITED INC AXILLA
1 series · 5 of 5 positions shown · non-contrast
Comparison: Previous exam(s).
COMPARISON: Previous exam(s).

Addendum:
CLINICAL DATA: Patient was called back from screening mammogram for
possible masses in the left breast.

EXAM:
DIGITAL DIAGNOSTIC LEFT MAMMOGRAM WITH CAD AND TOMO
ULTRASOUND LEFT BREAST

[Series 1: us breast*right* limited inc axilla · 0.06mm/px · 5 of 5 slices shown]
[im 1/5]
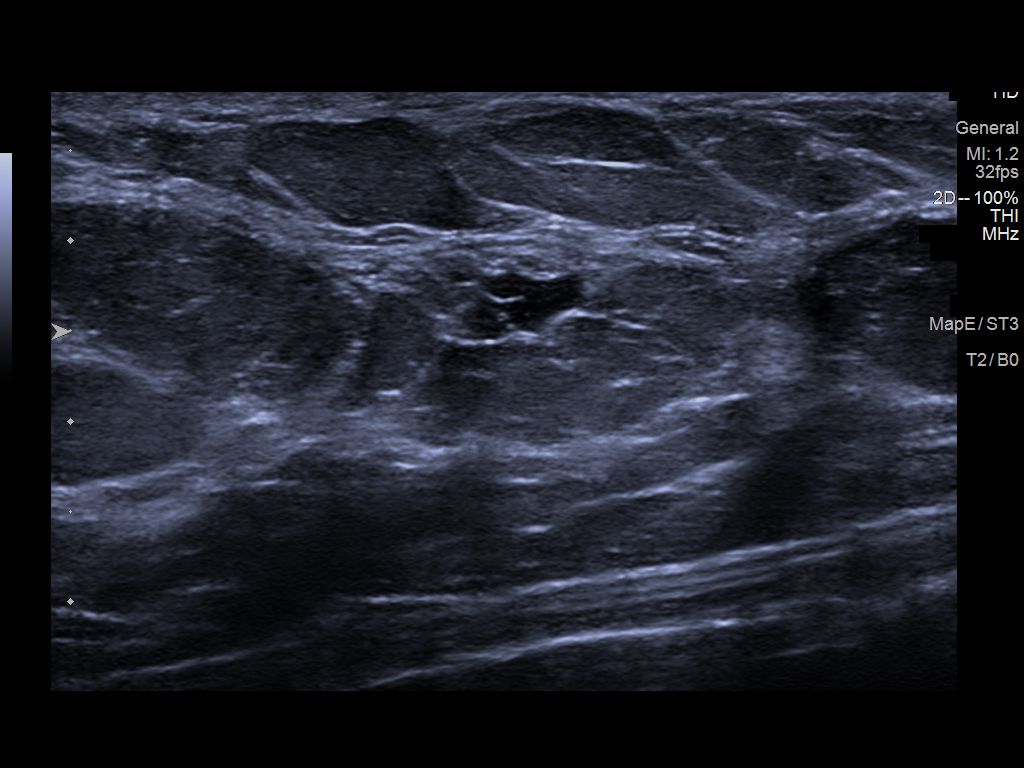
[im 2/5]
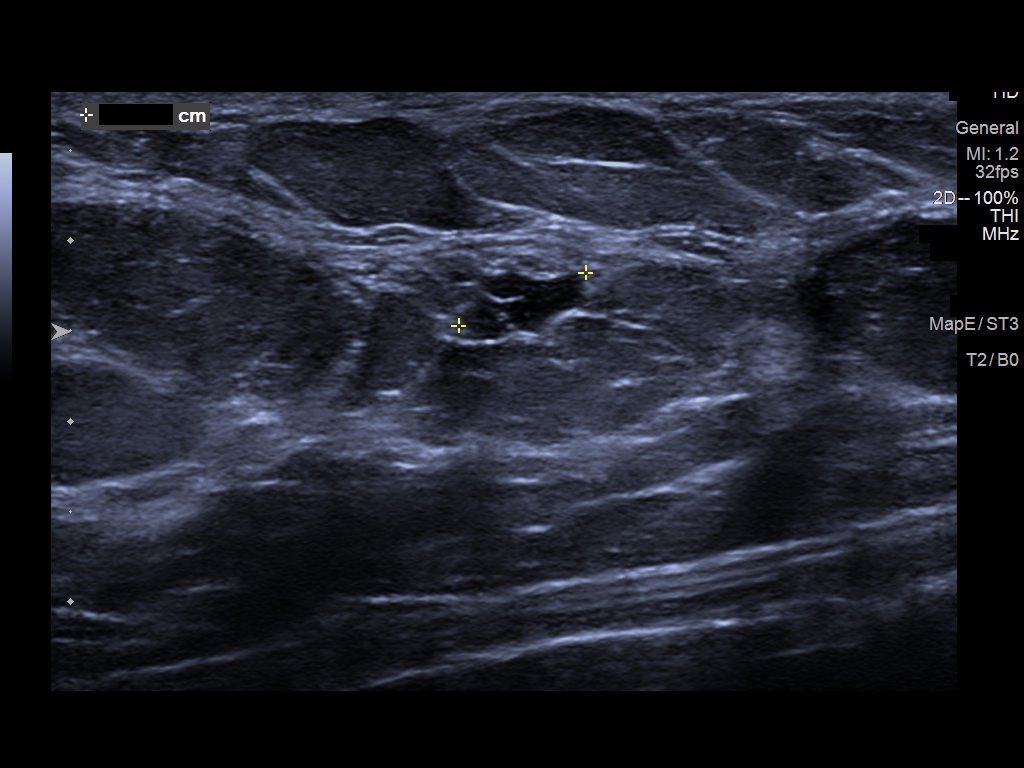
[im 3/5]
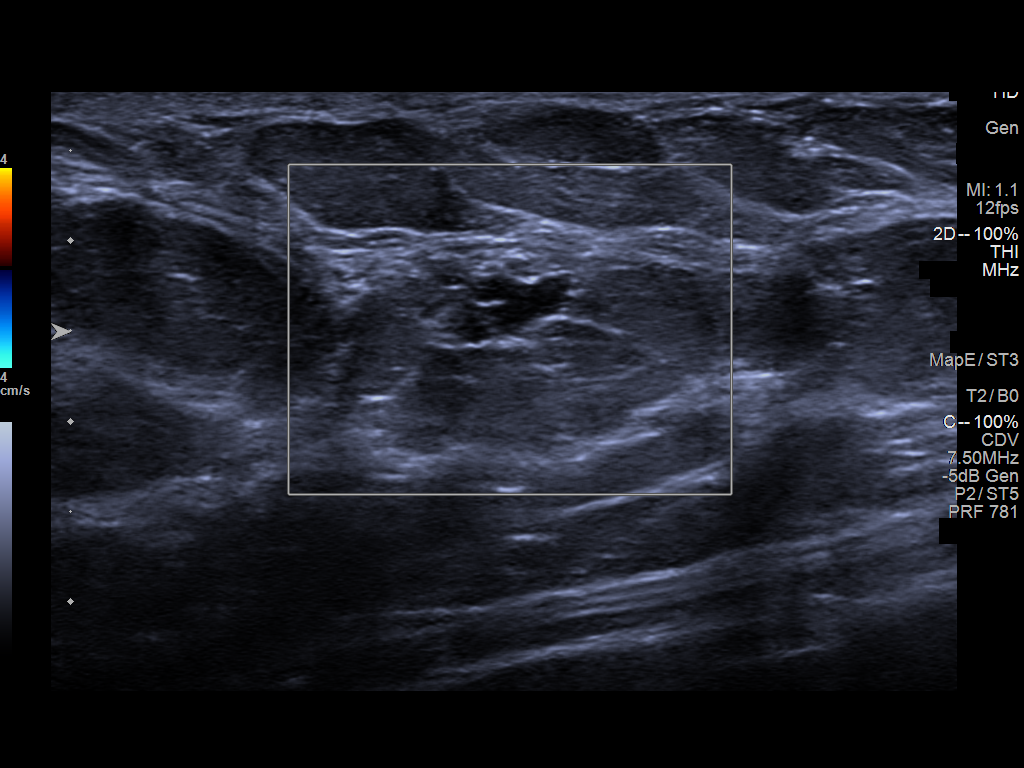
[im 4/5]
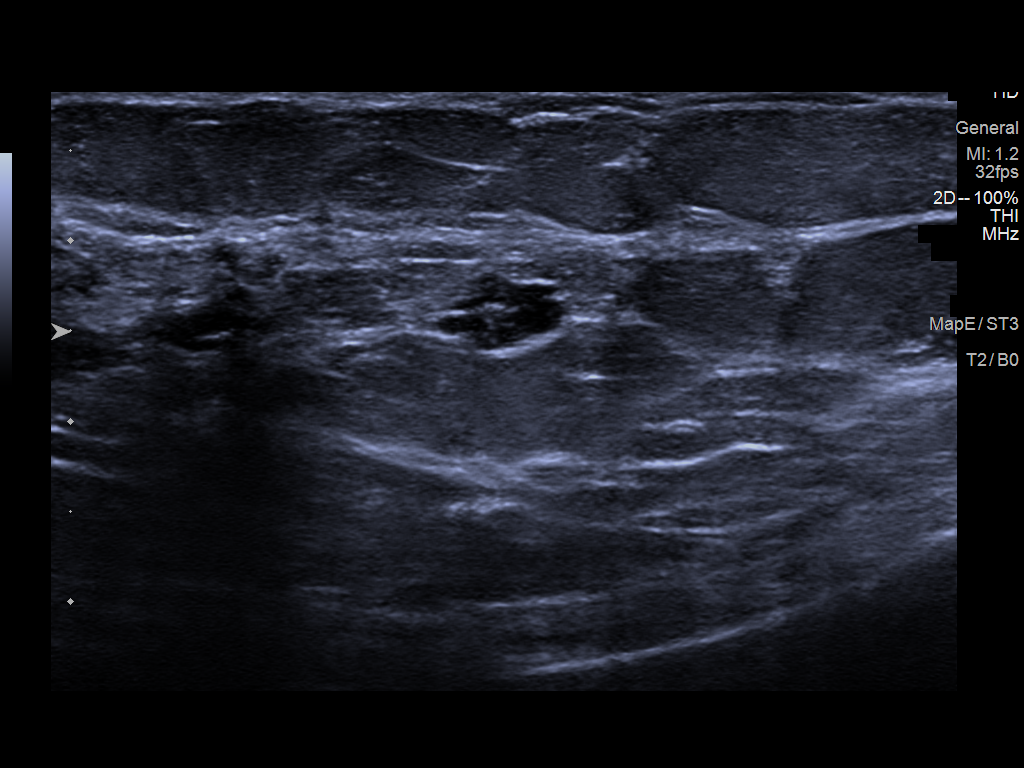
[im 5/5]
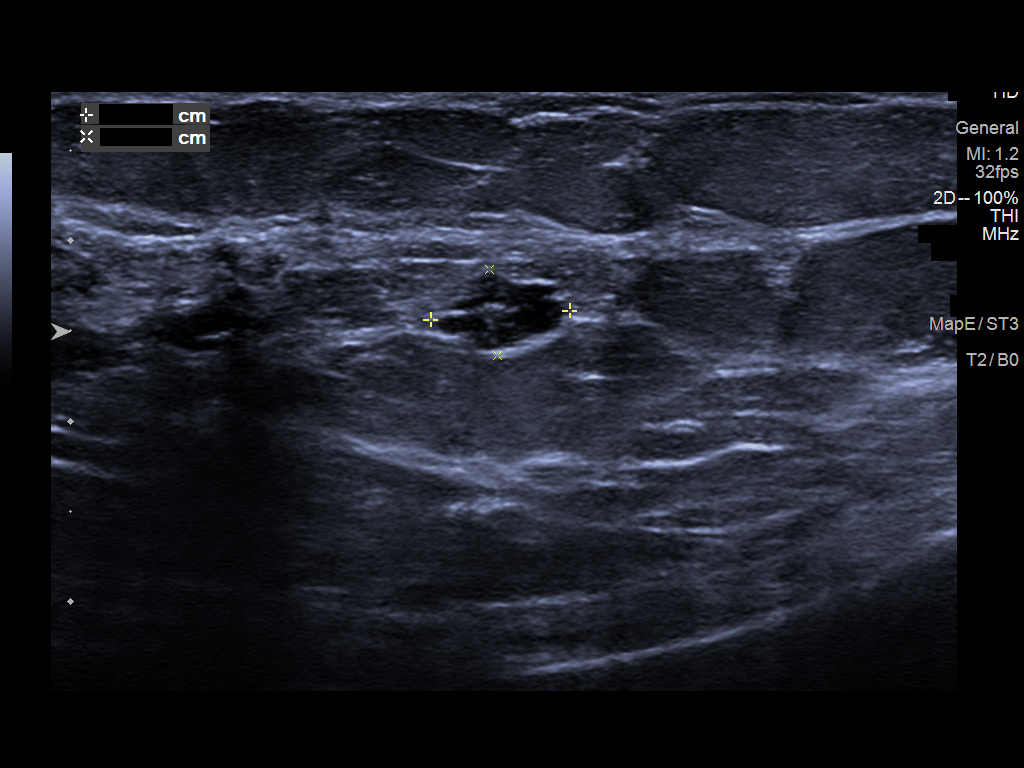

[5 of 5 positions shown; findings below may reference images not displayed]

ACR Breast Density Category c: The breast tissue is heterogeneously
dense, which may obscure small masses.
FINDINGS: Additional imaging of the left breast was performed. There is
persistence of a 7 mm mass in the middle third of the lower-inner
quadrant of the breast.

Mammographic images were processed with CAD.

On physical exam, I do not palpate a mass in the lower-inner
quadrant of the right breast.

Targeted ultrasound is performed, showing a cluster of near anechoic
cysts (apocrine metaplasia) in the right breast at [DATE] 3 cm from
the nipple measuring 8 x 5 x 8 mm.
IMPRESSION: Probable benign cluster of cysts in the right breast.

RECOMMENDATION:
Short-term interval follow-up right breast ultrasound in 6 months is
recommended.

I have discussed the findings and recommendations with the patient.
If applicable, a reminder letter will be sent to the patient
regarding the next appointment.

BI-RADS CATEGORY  3: Probably benign.

ADDENDUM:
Diagnostic exam dated 01/05/2019 should state the exam was a
DIAGNOSTIC RIGHT MAMMOGRAM AND ULTRASOUND OF THE RIGHT BREAST.

Additional imaging of the RIGHT breast was performed.

*** End of Addendum ***
ACR Breast Density Category c: The breast tissue is heterogeneously
dense, which may obscure small masses.
FINDINGS: Additional imaging of the left breast was performed. There is
persistence of a 7 mm mass in the middle third of the lower-inner
quadrant of the breast.

Mammographic images were processed with CAD.

On physical exam, I do not palpate a mass in the lower-inner
quadrant of the right breast.

Targeted ultrasound is performed, showing a cluster of near anechoic
cysts (apocrine metaplasia) in the right breast at [DATE] 3 cm from
the nipple measuring 8 x 5 x 8 mm.
IMPRESSION: Probable benign cluster of cysts in the right breast.

RECOMMENDATION:
Short-term interval follow-up right breast ultrasound in 6 months is
recommended.

I have discussed the findings and recommendations with the patient.
If applicable, a reminder letter will be sent to the patient
regarding the next appointment.

BI-RADS CATEGORY  3: Probably benign.

## 2021-01-10 LAB — COLOGUARD

## 2022-03-04 ENCOUNTER — Encounter: Payer: Self-pay | Admitting: Obstetrics

## 2022-03-05 ENCOUNTER — Other Ambulatory Visit: Payer: Self-pay | Admitting: Obstetrics

## 2022-03-05 DIAGNOSIS — N6001 Solitary cyst of right breast: Secondary | ICD-10-CM

## 2022-03-20 ENCOUNTER — Ambulatory Visit
Admission: RE | Admit: 2022-03-20 | Discharge: 2022-03-20 | Disposition: A | Discharge status: Home / Self Care | Payer: Self-pay | Source: Ambulatory Visit | Attending: Obstetrics | Admitting: Obstetrics

## 2022-03-20 DIAGNOSIS — N6001 Solitary cyst of right breast: Secondary | ICD-10-CM
